# Patient Record
Sex: Male | Born: 1975 | Hispanic: Yes | Marital: Married | Smoking: Never smoker
Health system: Southern US, Community
[De-identification: ages and names within clinical notes are randomized; demographics above are authoritative.]

---

## 2019-10-07 ENCOUNTER — Emergency Department
Admission: EM | Admit: 2019-10-07 | Discharge: 2019-10-07 | Disposition: A | Discharge status: Home / Self Care | Payer: BC Managed Care – PPO | Attending: Emergency Medicine | Admitting: Emergency Medicine

## 2019-10-07 ENCOUNTER — Encounter: Payer: Self-pay | Admitting: Emergency Medicine

## 2019-10-07 ENCOUNTER — Other Ambulatory Visit: Payer: Self-pay

## 2019-10-07 ENCOUNTER — Emergency Department: Payer: BC Managed Care – PPO

## 2019-10-07 DIAGNOSIS — S60922A Unspecified superficial injury of left hand, initial encounter: Secondary | ICD-10-CM | POA: Diagnosis present

## 2019-10-07 DIAGNOSIS — Y939 Activity, unspecified: Secondary | ICD-10-CM | POA: Diagnosis not present

## 2019-10-07 DIAGNOSIS — Y999 Unspecified external cause status: Secondary | ICD-10-CM | POA: Insufficient documentation

## 2019-10-07 DIAGNOSIS — W260XXA Contact with knife, initial encounter: Secondary | ICD-10-CM | POA: Insufficient documentation

## 2019-10-07 DIAGNOSIS — S61412A Laceration without foreign body of left hand, initial encounter: Secondary | ICD-10-CM | POA: Insufficient documentation

## 2019-10-07 DIAGNOSIS — Y92009 Unspecified place in unspecified non-institutional (private) residence as the place of occurrence of the external cause: Secondary | ICD-10-CM | POA: Diagnosis not present

## 2019-10-07 DIAGNOSIS — Z23 Encounter for immunization: Secondary | ICD-10-CM | POA: Diagnosis not present

## 2019-10-07 MED ORDER — SULFAMETHOXAZOLE-TRIMETHOPRIM 400-80 MG PO TABS: 1.0000 | ORAL_TABLET | Freq: Two times a day (BID) | ORAL | 0 refills | Status: AC

## 2019-10-07 MED ORDER — SULFAMETHOXAZOLE-TRIMETHOPRIM 800-160 MG PO TABS
1.0000 | ORAL_TABLET | Freq: Once | ORAL | Status: AC
  Administered 2019-10-07: 1 via ORAL
  Filled 2019-10-07: qty 1

## 2019-10-07 MED ORDER — TETANUS-DIPHTH-ACELL PERTUSSIS 5-2.5-18.5 LF-MCG/0.5 IM SUSP
0.5000 mL | Freq: Once | INTRAMUSCULAR | Status: AC
  Administered 2019-10-07: 0.5 mL via INTRAMUSCULAR
  Filled 2019-10-07: qty 0.5

## 2019-10-07 MED ORDER — LIDOCAINE HCL (PF) 1 % IJ SOLN
5.0000 mL | Freq: Once | INTRAMUSCULAR | Status: AC
  Administered 2019-10-07: 5 mL
  Filled 2019-10-07: qty 5

## 2019-10-07 NOTE — ED Provider Notes (Signed)
[facility] Emergency Department Provider Note ____________________________________________  Time seen: 1545  I have reviewed the triage vital signs and the nursing notes.  HISTORY  Chief Complaint  Laceration   HPI Douglas Solomon is a 44 y.o. male presents to the ER today with complaint of laceration of his left palm.  He reports he was trying to cut a coconut earlier when the knife slipped and went into his left palm.  He was able to control the bleeding at home.  He wrapped the wound but did not clean it PTA.  He currently denies pain, numbness, tingling or weakness of the left hand.  He reports  History reviewed. No pertinent past medical history.  There are no problems to display for this patient.   History reviewed. No pertinent surgical history.  Prior to Admission medications   Medication Sig Start Date End Date Taking? Authorizing Provider  sulfamethoxazole-trimethoprim (BACTRIM) 400-80 MG tablet Take 1 tablet by mouth 2 (two) times daily. 10/07/19   Jearld Fenton, NP    Allergies Patient has no known allergies.  No family history on file.  Social History Social History   Tobacco Use  . Smoking status: Not on file  Substance Use Topics  . Alcohol use: Not on file  . Drug use: Not on file    Review of Systems  Constitutional: Negative for fever, chills or body aches. Cardiovascular: Negative for chest pain or chest tightness. Respiratory: Negative for cough or shortness of breath. Musculoskeletal: Negative for hand pain or swelling.. Skin: Positive for laceration to left palm. Neurological: Negative for focal weakness, tingling or numbness. ____________________________________________  PHYSICAL EXAM:  VITAL SIGNS: ED Triage Vitals  Enc Vitals Group     BP 10/07/19 1456 118/78     Pulse Rate 10/07/19 1456 78     Resp 10/07/19 1456 18     Temp 10/07/19 1456 98 F (36.7 C)     Temp Source 10/07/19 1456 Oral     SpO2 10/07/19 1456  98 %     Weight 10/07/19 1436 150 lb (68 kg)     Height 10/07/19 1436 5\' 6"  (1.676 m)     Head Circumference --      Peak Flow --      Pain Score 10/07/19 1436 0     Pain Loc --      Pain Edu? --      Excl. in [place]? --     Constitutional: Alert and oriented. Well appearing and in no distress. Cardiovascular: Normal rate, regular rhythm.  Radial pulse 2+ on the left. Respiratory: Normal respiratory effort. No wheezes/rales/rhonchi. Musculoskeletal: Normal flexion, extension and rotation of the left wrist.  Normal flexion and extension of the fingers.  Handgrips equal. Neurologic:  . Normal speech and language. No gross focal neurologic deficits are appreciated. Skin: 1 cm laceration noted mid left palm. Psychiatric: Mood and affect are normal. Patient exhibits appropriate insight and judgment.  ____________________________________________   RADIOLOGY  Imaging Orders     DG Hand Complete Left IMPRESSION:  Negative.    ____________________________________________  PROCEDURES  .Marland KitchenLaceration Repair  Date/Time: 10/07/2019 5:00 PM Performed by: Jearld Fenton, NP Authorized by: Jearld Fenton, NP   Consent:    Consent obtained:  Verbal   Consent given by:  Patient   Risks discussed:  Infection, pain and poor cosmetic result   Alternatives discussed:  No treatment Anesthesia (see MAR for exact dosages):    Anesthesia method:  Local infiltration  Local anesthetic:  Lidocaine 1% w/o epi Laceration details:    Location:  Hand   Hand location:  L palm   Length (cm):  1   Depth (mm):  4 Repair type:    Repair type:  Simple Pre-procedure details:    Preparation:  Patient was prepped and draped in usual sterile fashion Exploration:    Hemostasis achieved with:  Direct pressure   Wound exploration: wound explored through full range of motion     Contaminated: no   Treatment:    Area cleansed with:  Betadine and saline   Amount of cleaning:  Standard   Irrigation solution:   Sterile water and sterile saline   Irrigation method:  Syringe Skin repair:    Repair method:  Sutures   Suture size:  4-0   Suture material:  Nylon   Suture technique:  Simple interrupted   Number of sutures:  2 Approximation:    Approximation:  Close Post-procedure details:    Dressing:  Open (no dressing)   Patient tolerance of procedure:  Tolerated well, no immediate complications  ____________________________________________  INITIAL IMPRESSION / ASSESSMENT AND PLAN / ED COURSE  Laceration of Left Palm:  Xray left hand negative Tdap today RX for Septra DS PO  x 1 Laceration sutured by provider- see procedure note RX for Septra 1 tab PO BID x 5 days Advised him to follow up with PCP in 1 week for suture removal ____________________________________________  FINAL CLINICAL IMPRESSION(S) / ED DIAGNOSES  Final diagnoses:  Laceration of left palm, initial encounter      Jearld Fenton, NP 10/07/19 [place]    Blake Divine, MD 10/07/19 1720

## 2019-10-07 NOTE — Discharge Instructions (Addendum)
You were seen today for laceration to your left palm.  Your x-ray was negative for foreign body or fracture.  You received 2 sutures today.  I have given you prescription for antibiotics to take twice daily for the next 5 days.  Please follow-up with your PCP or this department in 1 week for suture removal.

## 2019-10-07 NOTE — ED Triage Notes (Signed)
Puncture wound to left palm with knife.  Bleeding controlled.  DSD applied.

## 2020-01-07 ENCOUNTER — Ambulatory Visit
Admission: EM | Admit: 2020-01-07 | Discharge: 2020-01-07 | Disposition: A | Discharge status: Home / Self Care | Payer: BC Managed Care – PPO | Attending: Family Medicine | Admitting: Family Medicine

## 2020-01-07 DIAGNOSIS — H8110 Benign paroxysmal vertigo, unspecified ear: Secondary | ICD-10-CM

## 2020-01-07 DIAGNOSIS — R42 Dizziness and giddiness: Secondary | ICD-10-CM | POA: Diagnosis not present

## 2020-01-07 MED ORDER — MECLIZINE HCL 12.5 MG PO TABS: 12.5000 mg | ORAL_TABLET | Freq: Three times a day (TID) | ORAL | 0 refills | Status: AC | PRN

## 2020-01-07 NOTE — ED Provider Notes (Signed)
[place]   [id number] 01/07/20 Arrival Time: 1039  CC: DIZZINESS  SUBJECTIVE:  Douglas Solomon is a 44 y.o. male who presents with complaint of dizziness that began yesterday. Denies a precipitating event, trauma, or recent URI within the past month. Describes the dizziness as "the room spinning." States that it is intermittent with episodes lasting a few minutes. Has not taken OTC medications for this. Symptoms made worse with movement and position changes. Denies to previous symptoms. Denies fever, chills, nausea, vomiting, hearing changes, tinnitus, ear pain, chest pain, syncope, SOB, weakness, slurred speech, memory or emotional changes, facial drooping/ asymmetry, incoordination, numbness or tingling, abdominal pain, changes in bowel or bladder habits.     ROS: As per HPI.  All other pertinent ROS negative.    History reviewed. No pertinent past medical history. History reviewed. No pertinent surgical history. No Known Allergies No current facility-administered medications on file prior to encounter.   Current Outpatient Medications on File Prior to Encounter  Medication Sig Dispense Refill  . sulfamethoxazole-trimethoprim (BACTRIM) 400-80 MG tablet Take 1 tablet by mouth 2 (two) times daily. 10 tablet 0   Social History   Socioeconomic History  . Marital status: Married    Spouse name: Not on file  . Number of children: Not on file  . Years of education: Not on file  . Highest education level: Not on file  Occupational History  . Not on file  Tobacco Use  . Smoking status: Never Smoker  . Smokeless tobacco: Never Used  Substance and Sexual Activity  . Alcohol use: Never  . Drug use: Never  . Sexual activity: Not on file  Other Topics Concern  . Not on file  Social History Narrative  . Not on file   Social Determinants of Health   Financial Resource Strain:   . Difficulty of Paying Living Expenses: Not on file  Food Insecurity:   . Worried About Paediatric nurse in the Last Year: Not on file  . Ran Out of Food in the Last Year: Not on file  Transportation Needs:   . Lack of Transportation (Medical): Not on file  . Lack of Transportation (Non-Medical): Not on file  Physical Activity:   . Days of Exercise per Week: Not on file  . Minutes of Exercise per Session: Not on file  Stress:   . Feeling of Stress : Not on file  Social Connections:   . Frequency of Communication with Friends and Family: Not on file  . Frequency of Social Gatherings with Friends and Family: Not on file  . Attends Religious Services: Not on file  . Active Member of Clubs or Organizations: Not on file  . Attends Archivist Meetings: Not on file  . Marital Status: Not on file  Intimate Partner Violence:   . Fear of Current or Ex-Partner: Not on file  . Emotionally Abused: Not on file  . Physically Abused: Not on file  . Sexually Abused: Not on file   No family history on file.  OBJECTIVE:  Vitals:   01/07/20 1043 01/07/20 1047  BP: 135/87   Pulse: 64   Resp: 18   Temp: 97.7 F (36.5 C)   SpO2: 98%   Weight:  163 lb 2.3 oz (74 kg)  Height:  5' 6.93" (1.7 m)    General appearance: alert; no distress Eyes: PERRLA; EOMI; conjunctiva normal HENT: normocephalic; atraumatic; TMs normal; nasal mucosa normal; oral mucosa normal Neck: supple with FROM Lungs:  clear to auscultation bilaterally Heart: regular rate and rhythm Abdomen: soft, non-tender; bowel sounds normal Extremities: no cyanosis or edema; symmetrical with no gross deformities Skin: warm and dry Neurologic: normal gait; normal symmetric reflexes; CN 2-12 grossly intact Psychological: alert and cooperative; normal mood and affect  Labs:  No results found for this or any previous visit (from the past 24 hour(s)). No orders found for this or any previous visit.  No results found.  ASSESSMENT & PLAN:  1. Dizziness and giddiness   2. Benign paroxysmal positional vertigo,  unspecified laterality     Meds ordered this encounter  Medications  . meclizine (ANTIVERT) 12.5 MG tablet    Sig: Take 1 tablet (12.5 mg total) by mouth 3 (three) times daily as needed for dizziness.    Dispense:  30 tablet    Refill:  0    Order Specific Question:   Supervising Provider    Answer:   Chase Picket [id number]   Prescribed meclizine Reviewed expectations re: course of current medical issues. Questions answered. Outlined signs and symptoms indicating need for more acute intervention. Patient verbalized understanding. After Visit Summary given.    Faustino Congress, NP 01/07/20 1128

## 2020-01-07 NOTE — Discharge Instructions (Signed)
I have sent in meclizine for you to take twice a day as needed for dizziness   Follow up with this office or with primary care if symptoms are persisting.  Follow up in the ER for high fever, trouble swallowing, trouble breathing, other concerning symptoms.

## 2020-01-07 NOTE — ED Triage Notes (Signed)
Pt sts he began to feel dizzy yesterday. When walking the dizziness increase.  "I think it is vertigo".

## 2020-11-09 ENCOUNTER — Ambulatory Visit: Payer: Self-pay | Admitting: *Deleted

## 2020-11-09 NOTE — Telephone Encounter (Signed)
Pt reports testicular pain left testicle, onset 3 months ago. States has "Bean size lump under that testicle." Painful to touch "Like a shock down my leg."  Denies any swelling, no fever. Reports pain at 3/10 "But sometimes 10 like a shock."  Also reports arthritis type pain of lower back, hips and both legs x 6 months. States seen by ortho, "Arthritis, no fractures." Pt is wishing to establish care at [facility]. Advised UC for acute issue with testicular pain. Advised acute issues would not be addressed at new pt visit. [facility] cannot schedule for Ms. Douglas Solomon. If pt able to establish care with her, please advise. CB# [phone]

## 2020-11-09 NOTE — Telephone Encounter (Signed)
Reason for Disposition  [1] Brief pain in scrotum or testicle AND [2] present < 1 hour AND [3] recurrent  (NO swelling)  Answer Assessment - Initial Assessment Questions 1. LOCATION and RADIATION: "Where is the pain located?"      Left testicle 2. QUALITY: "What does the pain feel like?"  (e.g., sharp, dull, aching, burning)     Painful with touching only, intermittent 3. SEVERITY: "How bad is the pain?"  (Scale 1-10; or mild, moderate, severe)   - MILD (1-3): doesn't interfere with normal activities    - MODERATE (4-7): interferes with normal activities (e.g., work or school) or awakens from sleep   - SEVERE (8-10): excruciating pain, unable to do any normal activities, difficulty walking     4-5/10   10/10 at times 4. ONSET: "When did the pain start?"     One month ago 5. PATTERN: "Does it come and go, or has it been constant since it started?"     Comes and goes 6. SCROTAL APPEARANCE: "What does the scrotum look like?" "Is there any swelling or redness?"      No 7. HERNIA: "Has a doctor ever told you that you have a hernia?"     no 8. OTHER SYMPTOMS: "Do you have any other symptoms?" (e.g., fever, abdominal pain, vomiting, difficulty passing urine)     Lower back and hips, legs.  Protocols used: Scrotal Pain-A-AH

## 2020-11-14 NOTE — Telephone Encounter (Signed)
Tried many times to contact patient to let him know [facility] is not taking new patients at this time but no one would ever answer the telephone.

## 2021-06-13 ENCOUNTER — Telehealth: Payer: Self-pay

## 2021-06-13 NOTE — Telephone Encounter (Signed)
CALLED PATIENT NO ANSWER LEFT VOICEMAIL FOR A CALL BACK ? ?

## 2021-06-14 ENCOUNTER — Other Ambulatory Visit: Payer: Self-pay

## 2021-06-14 DIAGNOSIS — Z1211 Encounter for screening for malignant neoplasm of colon: Secondary | ICD-10-CM

## 2021-06-14 MED ORDER — PEG 3350-KCL-NA BICARB-NACL 420 G PO SOLR: 4000.0000 mL | Freq: Once | ORAL | 0 refills | Status: AC

## 2021-06-14 NOTE — Progress Notes (Signed)
Gastroenterology Pre-Procedure Review ? ?Request Date: 06/30/2021 ?Requesting Physician: Dr. Vicente Males ? ?PATIENT REVIEW QUESTIONS: The patient responded to the following health history questions as indicated:   ? ?1. Are you having any GI issues? no ?2. Do you have a personal history of Polyps? no ?3. Do you have a family history of Colon Cancer or Polyps? no ?4. Diabetes Mellitus? no ?5. Joint replacements in the past 12 months?no ?6. Major health problems in the past 3 months?no ?7. Any artificial heart valves, MVP, or defibrillator?no ?   ?MEDICATIONS & ALLERGIES:    ?Patient reports the following regarding taking any anticoagulation/antiplatelet therapy:   ?Plavix, Coumadin, Eliquis, Xarelto, Lovenox, Pradaxa, Brilinta, or Effient? no ?Aspirin? no ? ?Patient confirms/reports the following medications:  ?Current Outpatient Medications  ?Medication Sig Dispense Refill  ? ciprofloxacin (CIPRO) 500 MG tablet SMARTSIG:1 Tablet(s) By Mouth Every 12 Hours    ? meclizine (ANTIVERT) 12.5 MG tablet Take 1 tablet (12.5 mg total) by mouth 3 (three) times daily as needed for dizziness. 30 tablet 0  ? metroNIDAZOLE (FLAGYL) 500 MG tablet Take 500 mg by mouth every 8 (eight) hours.    ? sulfamethoxazole-trimethoprim (BACTRIM) 400-80 MG tablet Take 1 tablet by mouth 2 (two) times daily. 10 tablet 0  ? ?No current facility-administered medications for this visit.  ? ? ?Patient confirms/reports the following allergies:  ?Allergies  ?Allergen Reactions  ? Prednisone Other (See Comments), Palpitations and Swelling  ?  Swelling of throat  ? ? ?No orders of the defined types were placed in this encounter. ? ? ?AUTHORIZATION INFORMATION ?Primary Insurance: ?1D#: ?Group #: ? ?Secondary Insurance: ?1D#: ?Group #: ? ?SCHEDULE INFORMATION: ?Date:  ?Time: ?Location: ? ?

## 2021-06-26 ENCOUNTER — Encounter: Payer: Self-pay | Admitting: Gastroenterology

## 2021-07-21 ENCOUNTER — Ambulatory Visit
Admission: RE | Admit: 2021-07-21 | Discharge: 2021-07-21 | Disposition: A | Discharge status: Home / Self Care | Payer: 59 | Source: Ambulatory Visit | Attending: Gastroenterology | Admitting: Gastroenterology

## 2021-07-21 ENCOUNTER — Encounter
Admission: RE | Disposition: A | Discharge status: Home / Self Care | Payer: Self-pay | Source: Ambulatory Visit | Attending: Gastroenterology

## 2021-07-21 ENCOUNTER — Encounter: Payer: Self-pay | Admitting: Gastroenterology

## 2021-07-21 ENCOUNTER — Ambulatory Visit: Payer: 59 | Admitting: Anesthesiology

## 2021-07-21 DIAGNOSIS — K219 Gastro-esophageal reflux disease without esophagitis: Secondary | ICD-10-CM | POA: Diagnosis not present

## 2021-07-21 DIAGNOSIS — Z1211 Encounter for screening for malignant neoplasm of colon: Secondary | ICD-10-CM | POA: Diagnosis present

## 2021-07-21 DIAGNOSIS — K635 Polyp of colon: Secondary | ICD-10-CM | POA: Diagnosis not present

## 2021-07-21 DIAGNOSIS — D125 Benign neoplasm of sigmoid colon: Secondary | ICD-10-CM | POA: Insufficient documentation

## 2021-07-21 HISTORY — PX: COLONOSCOPY WITH PROPOFOL: SHX5780

## 2021-07-21 SURGERY — COLONOSCOPY WITH PROPOFOL
Anesthesia: General

## 2021-07-21 MED ORDER — PROPOFOL 10 MG/ML IV BOLUS
INTRAVENOUS | Status: DC | PRN
  Administered 2021-07-21 (×2): 30 mg via INTRAVENOUS
  Administered 2021-07-21: 70 mg via INTRAVENOUS

## 2021-07-21 MED ORDER — PROPOFOL 500 MG/50ML IV EMUL
INTRAVENOUS | Status: DC | PRN
  Administered 2021-07-21: 150 ug/kg/min via INTRAVENOUS

## 2021-07-21 MED ORDER — PROPOFOL 500 MG/50ML IV EMUL
INTRAVENOUS | Status: AC
  Filled 2021-07-21: qty 50

## 2021-07-21 MED ORDER — LIDOCAINE HCL (CARDIAC) PF 100 MG/5ML IV SOSY
PREFILLED_SYRINGE | INTRAVENOUS | Status: DC | PRN
  Administered 2021-07-21: 50 mg via INTRAVENOUS

## 2021-07-21 MED ORDER — LIDOCAINE HCL (PF) 2 % IJ SOLN
INTRAMUSCULAR | Status: AC
  Filled 2021-07-21: qty 5

## 2021-07-21 MED ORDER — STERILE WATER FOR IRRIGATION IR SOLN
Status: DC | PRN
  Administered 2021-07-21: 60 mL

## 2021-07-21 MED ORDER — SODIUM CHLORIDE 0.9 % IV SOLN
INTRAVENOUS | Status: DC
  Administered 2021-07-21: 20 mL/h via INTRAVENOUS

## 2021-07-21 NOTE — Transfer of Care (Signed)
Immediate Anesthesia Transfer of Care Note  Patient: Douglas Solomon  Procedure(s) Performed: COLONOSCOPY WITH PROPOFOL  Patient Location: PACU  Anesthesia Type:General  Level of Consciousness: awake and drowsy  Airway & Oxygen Therapy: Patient Spontanous Breathing  Post-op Assessment: Report given to RN and Post -op Vital signs reviewed and stable  Post vital signs: Reviewed and stable  Last Vitals:  Vitals Value Taken Time  BP 113/71 07/21/21 0954  Temp    Pulse 84 07/21/21 0954  Resp 14 07/21/21 0954  SpO2 95 % 07/21/21 0954    Last Pain:  Vitals:   07/21/21 0806  TempSrc: Temporal  PainSc: 3          Complications: No notable events documented.

## 2021-07-21 NOTE — H&P (Signed)
     Jonathon Bellows, MD [address], [place], [place], [place], [address] [address], [place], [place], [place], [address] Phone: [phone]  Fax: [phone]  Primary Care Physician:  [place], [place]   Pre-Procedure History & Physical: HPI:  Douglas Solomon is a 46 y.o. male is here for an colonoscopy.   No past medical history on file.  No past surgical history on file.  Prior to Admission medications   Medication Sig Start Date End Date Taking? Authorizing Provider  ciprofloxacin (CIPRO) 500 MG tablet SMARTSIG:1 Tablet(s) By Mouth Every 12 Hours 01/02/21  Yes [provider]  meclizine (ANTIVERT) 12.5 MG tablet Take 1 tablet (12.5 mg total) by mouth 3 (three) times daily as needed for dizziness. 01/07/20  Yes Faustino Congress, NP  metroNIDAZOLE (FLAGYL) 500 MG tablet Take 500 mg by mouth every 8 (eight) hours. 01/02/21  Yes [provider]  sulfamethoxazole-trimethoprim (BACTRIM) 400-80 MG tablet Take 1 tablet by mouth 2 (two) times daily. 10/07/19   Jearld Fenton, NP    Allergies as of 06/14/2021 - Review Complete 01/07/2020  Allergen Reaction Noted   Prednisone Other (See Comments), Palpitations, and Swelling 10/20/2020    No family history on file.  Social History   Socioeconomic History   Marital status: Married    Spouse name: Not on file   Number of children: Not on file   Years of education: Not on file   Highest education level: Not on file  Occupational History   Not on file  Tobacco Use   Smoking status: Never   Smokeless tobacco: Never  Substance and Sexual Activity   Alcohol use: Never   Drug use: Never   Sexual activity: Not on file  Other Topics Concern   Not on file  Social History Narrative   Not on file   Social Determinants of Health   Financial Resource Strain: Not on file  Food Insecurity: Not on file  Transportation Needs: Not on file  Physical Activity: Not on file  Stress: Not on file  Social Connections:  Not on file  Intimate Partner Violence: Not on file    Review of Systems: See HPI, otherwise negative ROS  Physical Exam: BP 115/85   Pulse 100   Temp (!) 96.4 F (35.8 C) (Temporal)   Resp 20   Ht '5\' 6"'$  (1.676 m)   Wt 77.1 kg   SpO2 (!) 64%   BMI 27.44 kg/m  General:   Alert,  pleasant and cooperative in NAD Head:  Normocephalic and atraumatic. Neck:  Supple; no masses or thyromegaly. Lungs:  Clear throughout to auscultation, normal respiratory effort.    Heart:  +S1, +S2, Regular rate and rhythm, No edema. Abdomen:  Soft, nontender and nondistended. Normal bowel sounds, without guarding, and without rebound.   Neurologic:  Alert and  oriented x4;  grossly normal neurologically.  Impression/Plan: Douglas Solomon is here for an colonoscopy to be performed for Screening colonoscopy average risk   Risks, benefits, limitations, and alternatives regarding  colonoscopy have been reviewed with the patient.  Questions have been answered.  All parties agreeable.   Jonathon Bellows, MD  07/21/2021, 9:20 AM

## 2021-07-21 NOTE — Anesthesia Preprocedure Evaluation (Signed)
Anesthesia Evaluation  Patient identified by MRN, date of birth, ID band Patient awake    Reviewed: Allergy & Precautions, NPO status , Patient's Chart, lab work & pertinent test results  History of Anesthesia Complications Negative for: history of anesthetic complications  Airway Mallampati: III  TM Distance: >3 FB Neck ROM: full    Dental  (+) Chipped, Poor Dentition, Missing   Pulmonary neg pulmonary ROS, neg shortness of breath,    Pulmonary exam normal        Cardiovascular (-) angina(-) Past MI negative cardio ROS Normal cardiovascular exam     Neuro/Psych negative neurological ROS  negative psych ROS   GI/Hepatic negative GI ROS, Neg liver ROS, neg GERD  ,  Endo/Other  negative endocrine ROS  Renal/GU negative Renal ROS  negative genitourinary   Musculoskeletal   Abdominal   Peds  Hematology negative hematology ROS (+)   Anesthesia Other Findings No past medical history on file.  No past surgical history on file.  BMI    Body Mass Index: 27.44 kg/m      Reproductive/Obstetrics negative OB ROS                             Anesthesia Physical Anesthesia Plan  ASA: 1  Anesthesia Plan: General   Post-op Pain Management:    Induction: Intravenous  PONV Risk Score and Plan: Propofol infusion and TIVA  Airway Management Planned: Natural Airway and Nasal Cannula  Additional Equipment:   Intra-op Plan:   Post-operative Plan:   Informed Consent: I have reviewed the patients History and Physical, chart, labs and discussed the procedure including the risks, benefits and alternatives for the proposed anesthesia with the patient or authorized representative who has indicated his/her understanding and acceptance.     Dental Advisory Given  Plan Discussed with: Anesthesiologist, CRNA and Surgeon  Anesthesia Plan Comments: (Patient declines interpreter   Patient  consented for risks of anesthesia including but not limited to:  - adverse reactions to medications - risk of airway placement if required - damage to eyes, teeth, lips or other oral mucosa - nerve damage due to positioning  - sore throat or hoarseness - Damage to heart, brain, nerves, lungs, other parts of body or loss of life  Patient voiced understanding.)        Anesthesia Quick Evaluation

## 2021-07-21 NOTE — Op Note (Signed)
[facility] Gastroenterology Patient Name: Douglas Solomon Procedure Date: 07/21/2021 9:21 AM MRN: [id number] Account #: [id number] Date of Birth: July 03, 1975 Admit Type: Outpatient Age: 46 Room: [facility] ENDO ROOM 4 Gender: Male Note Status: Finalized Instrument Name: Jasper Riling [id number] Procedure:             Colonoscopy Indications:           Screening for colorectal malignant neoplasm Providers:             Jonathon Bellows MD, MD Referring MD:          [place] (Referring MD) Medicines:             Monitored Anesthesia Care Complications:         No immediate complications. Procedure:             Pre-Anesthesia Assessment:                        - Prior to the procedure, a History and Physical was                         performed, and patient medications, allergies and                         sensitivities were reviewed. The patient's tolerance                         of previous anesthesia was reviewed.                        - The risks and benefits of the procedure and the                         sedation options and risks were discussed with the                         patient. All questions were answered and informed                         consent was obtained.                        - ASA Grade Assessment: II - A patient with mild                         systemic disease.                        After obtaining informed consent, the colonoscope was                         passed under direct vision. Throughout the procedure,                         the patient's blood pressure, pulse, and oxygen                         saturations were monitored continuously. The                         Colonoscope was introduced  through the anus and                         advanced to the the cecum, identified by the                         appendiceal orifice. The colonoscopy was performed                         with ease. The patient tolerated the procedure well.                          The quality of the bowel preparation was excellent. Findings:      The perianal and digital rectal examinations were normal.      A 10 mm polyp was found in the sigmoid colon. The polyp was       semi-pedunculated. The polyp was removed with a cold snare. Resection       and retrieval were complete. To prevent bleeding after the polypectomy,       two hemostatic clips were successfully placed. There was no bleeding       during, or at the end, of the procedure.      The exam was otherwise without abnormality on direct and retroflexion       views. Impression:            - One 10 mm polyp in the sigmoid colon, removed with a                         cold snare. Resected and retrieved. Clips were placed.                        - The examination was otherwise normal on direct and                         retroflexion views. Recommendation:        - Discharge patient to home (with escort).                        - Resume previous diet.                        - Continue present medications.                        - Await pathology results.                        - Repeat colonoscopy for surveillance based on                         pathology results. Procedure Code(s):     --- Professional ---                        [phone], Colonoscopy, flexible; with removal of                         tumor(s), polyp(s), or other lesion(s) by snare  technique Diagnosis Code(s):     --- Professional ---                        K63.5, Polyp of colon                        Z12.11, Encounter for screening for malignant neoplasm                         of colon CPT copyright 2019 [employer]. All rights reserved. The codes documented in this report are preliminary and upon coder review may  be revised to meet current compliance requirements. Jonathon Bellows, MD Jonathon Bellows MD, MD 07/21/2021 9:50:29 AM This report has been signed electronically. Number of  Addenda: 0 Note Initiated On: 07/21/2021 9:21 AM Scope Withdrawal Time: 0 hours 11 minutes 39 seconds  Total Procedure Duration: 0 hours 15 minutes 5 seconds  Estimated Blood Loss:  Estimated blood loss: none.      [facility]

## 2021-07-21 NOTE — Anesthesia Postprocedure Evaluation (Signed)
Anesthesia Post Note  Patient: Douglas Solomon  Procedure(s) Performed: COLONOSCOPY WITH PROPOFOL  Patient location during evaluation: Endoscopy Anesthesia Type: General Level of consciousness: awake and alert Pain management: pain level controlled Vital Signs Assessment: post-procedure vital signs reviewed and stable Respiratory status: spontaneous breathing, nonlabored ventilation, respiratory function stable and patient connected to nasal cannula oxygen Cardiovascular status: blood pressure returned to baseline and stable Postop Assessment: no apparent nausea or vomiting Anesthetic complications: no   No notable events documented.   Last Vitals:  Vitals:   07/21/21 1000 07/21/21 1006  BP: 110/76 (P) 110/76  Pulse: 71   Resp: 13   Temp:    SpO2: 100%     Last Pain:  Vitals:   07/21/21 0950  TempSrc: Temporal  PainSc:                  Precious Haws Avrian Delfavero

## 2021-07-24 ENCOUNTER — Encounter: Payer: Self-pay | Admitting: Gastroenterology

## 2021-07-24 LAB — SURGICAL PATHOLOGY

## 2021-07-24 IMAGING — DX DG HAND COMPLETE 3+V*L*
3 series · 3 of 3 positions shown · non-contrast
Comparison: None.

CLINICAL DATA: Left hand laceration.

EXAM:
LEFT HAND - COMPLETE 3+ VIEW

[hand ap]
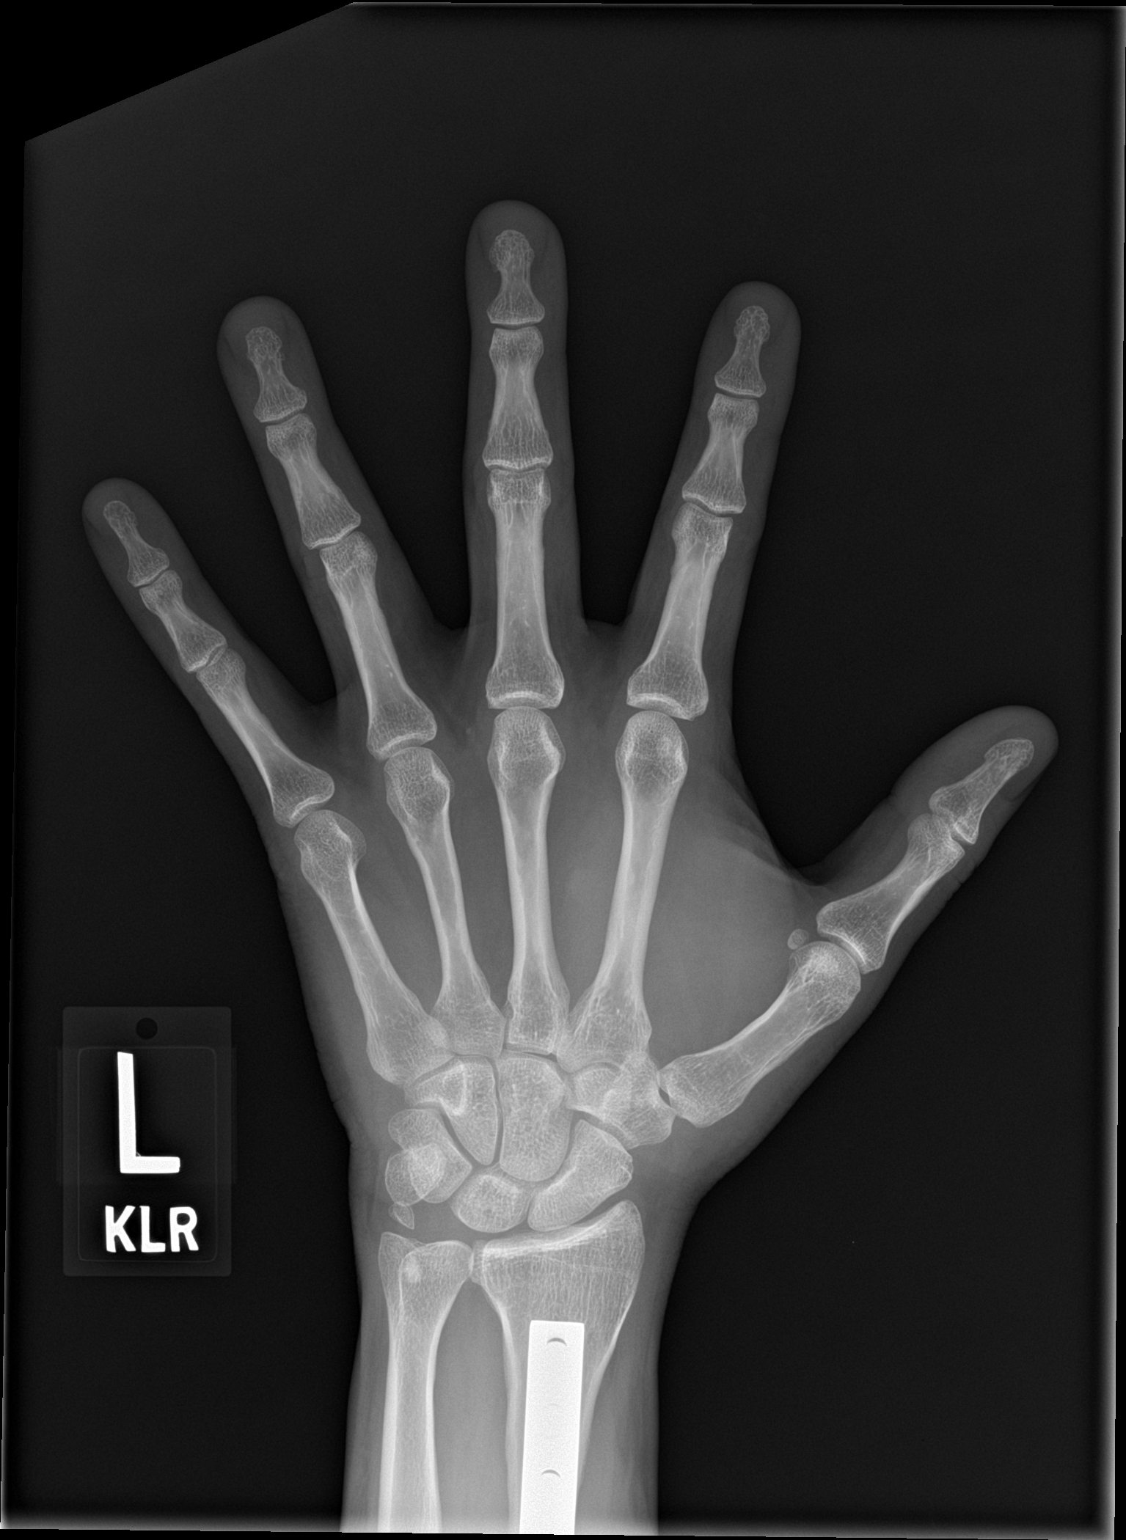

[hand obl]
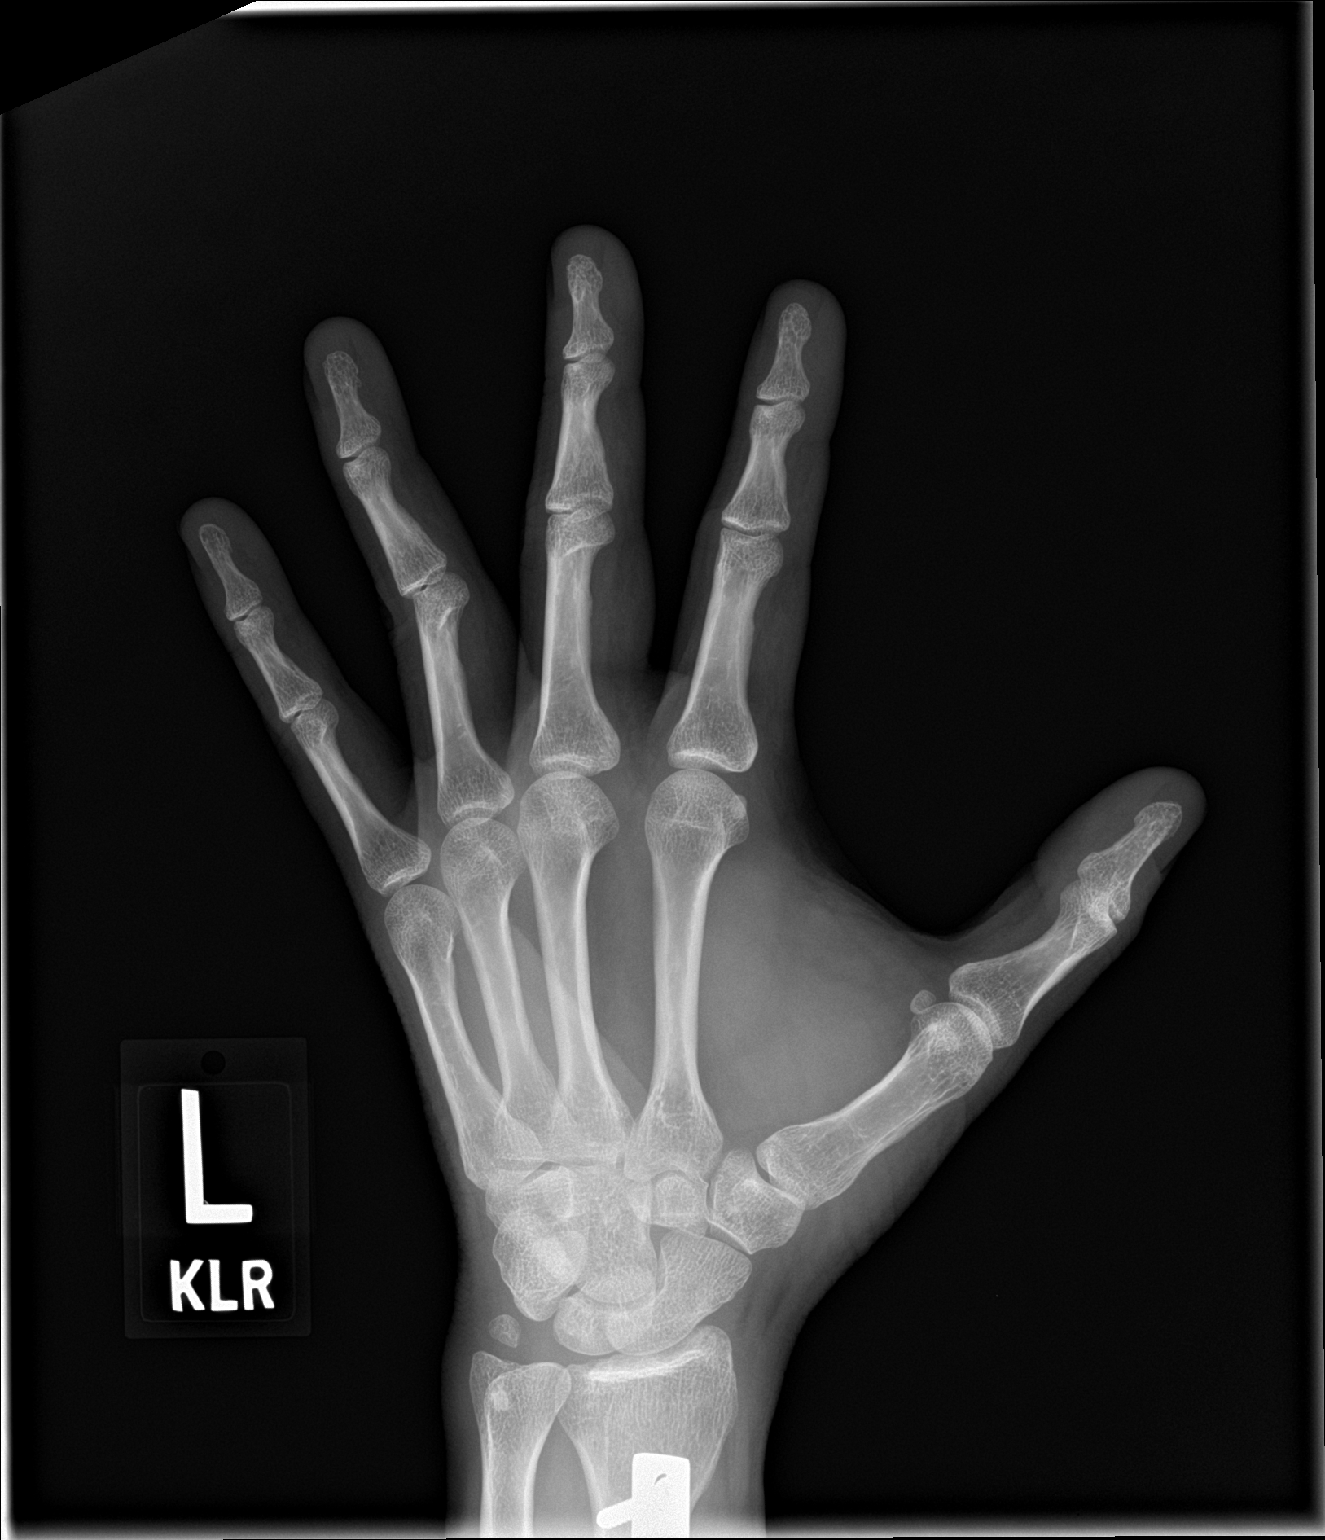

[hand lat]
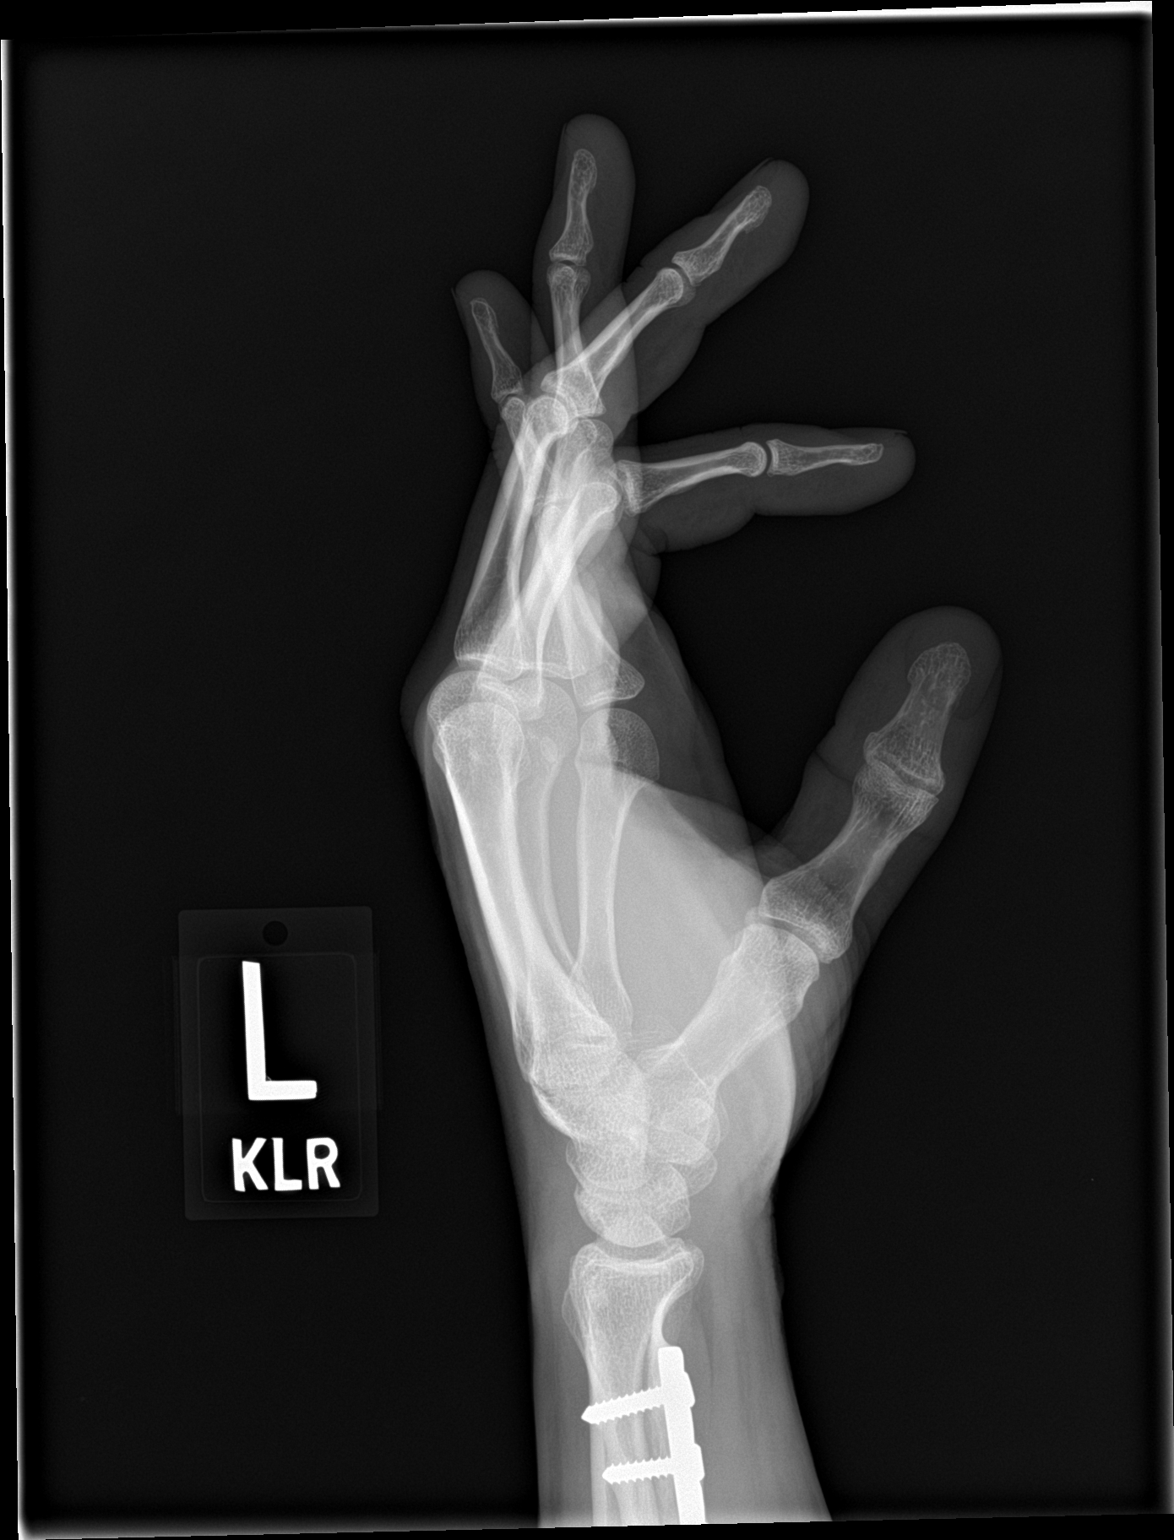

[3 of 3 positions shown; findings below may reference images not displayed]

FINDINGS: There is no evidence of acute fracture or dislocation. There is no
evidence of arthropathy. Old ulnar styloid fracture is noted. Soft
tissues are unremarkable.
IMPRESSION: Negative.

## 2021-07-25 ENCOUNTER — Telehealth: Payer: Self-pay | Admitting: Gastroenterology

## 2021-07-25 NOTE — Telephone Encounter (Signed)
Pt hss rash on neck he said it started after his procedure on 05/19 he would like a call back to get some advice on what he should do because it itches very bad.

## 2021-07-26 NOTE — Telephone Encounter (Signed)
Called patient back to ask him what his symptoms were. He stated that he went to Urgent Care yesterday and they told him that he had shingles. Patient stated that they gave him a treatment and that he started to take yesterday. I told him to finish his medication and that if he had other concerns or questions, to call us back. Patient agreed.
